# Patient Record
Sex: Male | Born: 1995 | Race: White | Hispanic: No | Marital: Single | Smoking: Current every day smoker
Health system: Southern US, Community
[De-identification: ages and names within clinical notes are randomized; demographics above are authoritative.]

## PROBLEM LIST (undated history)

## (undated) DIAGNOSIS — K219 Gastro-esophageal reflux disease without esophagitis: Secondary | ICD-10-CM

---
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
## 2019-09-04 ENCOUNTER — Emergency Department (HOSPITAL_COMMUNITY)
Admission: EM | Admit: 2019-09-04 | Discharge: 2019-09-05 | Disposition: A | Discharge status: Home / Self Care | Payer: Self-pay | Attending: Emergency Medicine | Admitting: Emergency Medicine

## 2019-09-04 ENCOUNTER — Other Ambulatory Visit: Payer: Self-pay

## 2019-09-04 ENCOUNTER — Encounter (HOSPITAL_COMMUNITY): Payer: Self-pay | Admitting: Emergency Medicine

## 2019-09-04 DIAGNOSIS — F17200 Nicotine dependence, unspecified, uncomplicated: Secondary | ICD-10-CM | POA: Insufficient documentation

## 2019-09-04 DIAGNOSIS — R441 Visual hallucinations: Secondary | ICD-10-CM | POA: Insufficient documentation

## 2019-09-04 DIAGNOSIS — Z20822 Contact with and (suspected) exposure to covid-19: Secondary | ICD-10-CM | POA: Insufficient documentation

## 2019-09-04 DIAGNOSIS — R440 Auditory hallucinations: Secondary | ICD-10-CM | POA: Insufficient documentation

## 2019-09-04 DIAGNOSIS — R443 Hallucinations, unspecified: Secondary | ICD-10-CM

## 2019-09-04 DIAGNOSIS — Z79899 Other long term (current) drug therapy: Secondary | ICD-10-CM | POA: Insufficient documentation

## 2019-09-04 DIAGNOSIS — Z046 Encounter for general psychiatric examination, requested by authority: Secondary | ICD-10-CM | POA: Insufficient documentation

## 2019-09-04 DIAGNOSIS — F15959 Other stimulant use, unspecified with stimulant-induced psychotic disorder, unspecified: Secondary | ICD-10-CM | POA: Insufficient documentation

## 2019-09-04 LAB — RAPID URINE DRUG SCREEN, HOSP PERFORMED
Amphetamines: NOT DETECTED
Barbiturates: NOT DETECTED
Benzodiazepines: NOT DETECTED
Cocaine: NOT DETECTED
Opiates: NOT DETECTED
Tetrahydrocannabinol: POSITIVE — AB

## 2019-09-04 LAB — COMPREHENSIVE METABOLIC PANEL
ALT: 58 U/L — ABNORMAL HIGH (ref 0–44)
AST: 32 U/L (ref 15–41)
Albumin: 3.8 g/dL (ref 3.5–5.0)
Alkaline Phosphatase: 95 U/L (ref 38–126)
Anion gap: 11 (ref 5–15)
BUN: 10 mg/dL (ref 6–20)
CO2: 25 mmol/L (ref 22–32)
Calcium: 8.9 mg/dL (ref 8.9–10.3)
Chloride: 104 mmol/L (ref 98–111)
Creatinine, Ser: 0.75 mg/dL (ref 0.61–1.24)
GFR calc Af Amer: >60 mL/min (ref >60)
GFR calc non Af Amer: >60 mL/min (ref >60)
Glucose, Bld: 106 mg/dL — ABNORMAL HIGH (ref 70–99)
Potassium: 4.1 mmol/L (ref 3.5–5.1)
Sodium: 140 mmol/L (ref 135–145)
Total Bilirubin: 0.3 mg/dL (ref 0.3–1.2)
Total Protein: 6.4 g/dL — ABNORMAL LOW (ref 6.5–8.1)

## 2019-09-04 LAB — ETHANOL: Alcohol, Ethyl (B): <10 mg/dL (ref <10)

## 2019-09-04 LAB — CBC
HCT: 45.6 % (ref 39.0–52.0)
Hemoglobin: 15.3 g/dL (ref 13.0–17.0)
MCH: 31.7 pg (ref 26.0–34.0)
MCHC: 33.6 g/dL (ref 30.0–36.0)
MCV: 94.6 fL (ref 80.0–100.0)
Platelets: 330 10*3/uL (ref 150–400)
RBC: 4.82 MIL/uL (ref 4.22–5.81)
RDW: 14.4 % (ref 11.5–15.5)
WBC: 9.3 10*3/uL (ref 4.0–10.5)
nRBC: 0 % (ref 0.0–0.2)

## 2019-09-04 NOTE — ED Notes (Signed)
Patient wearing purple scrubs/wanded by security.

## 2019-09-04 NOTE — ED Notes (Signed)
Patient stepped outside  

## 2019-09-04 NOTE — ED Triage Notes (Signed)
Patient requesting psychiatric treatment for his  auditory/visual hallucinations and methamphetamine addiction . Denies suicidal or homicidal ideation .

## 2019-09-05 ENCOUNTER — Emergency Department (HOSPITAL_COMMUNITY): Payer: Medicaid Other

## 2019-09-05 ENCOUNTER — Encounter (HOSPITAL_COMMUNITY): Payer: Self-pay | Admitting: Student

## 2019-09-05 LAB — SARS CORONAVIRUS 2 BY RT PCR (HOSPITAL ORDER, PERFORMED IN ~~LOC~~ HOSPITAL LAB): SARS Coronavirus 2: NEGATIVE

## 2019-09-05 MED ORDER — NICOTINE 21 MG/24HR TD PT24: 21.0000 mg | MEDICATED_PATCH | Freq: Every day | TRANSDERMAL | Status: DC

## 2019-09-05 MED ORDER — ONDANSETRON HCL 4 MG PO TABS: 4.0000 mg | ORAL_TABLET | Freq: Three times a day (TID) | ORAL | Status: DC | PRN

## 2019-09-05 MED ORDER — ALUM & MAG HYDROXIDE-SIMETH 200-200-20 MG/5ML PO SUSP: 30.0000 mL | Freq: Four times a day (QID) | ORAL | Status: DC | PRN

## 2019-09-05 MED ORDER — ACETAMINOPHEN 325 MG PO TABS: 650.0000 mg | ORAL_TABLET | ORAL | Status: DC | PRN

## 2019-09-05 MED ORDER — ZOLPIDEM TARTRATE 5 MG PO TABS: 5.0000 mg | ORAL_TABLET | Freq: Every evening | ORAL | Status: DC | PRN

## 2019-09-05 NOTE — ED Provider Notes (Addendum)
[facility] EMERGENCY DEPARTMENT Provider Note   CSN: [id number] Arrival date & time: 09/04/19  2233     History Chief Complaint  Patient presents with  . Hallucinations    Drug Addiction     Stephen Dickerson is a 24 y.o. male with a history of GERD who presents to the emergency department with complaints of hallucinations for the past 1 month.  Patient states he is having auditory and visual hallucinations daily, he states he hears voices and just sees things, he does not further elaborate on this.  No alleviating or aggravating factors.  No intervention prior to arrival.  He states that he had been using methamphetamines daily, he stopped this 1 week ago, however he has continued to have hallucinations.  He denies any additional substance use. Denies alcohol use.  He denies SI or HI.  He denies fever, chills, numbness, weakness, visual disturbance, passing out, or head injury.   HPI     Past Medical History:  Diagnosis Date  . GERD (gastroesophageal reflux disease)     There are no problems to display for this patient.   History reviewed. No pertinent surgical history.     No family history on file.  Social History   Tobacco Use  . Smoking status: Current Every Day Smoker  . Smokeless tobacco: Never Used  Substance Use Topics  . Alcohol use: Never  . Drug use: Yes    Types: Methamphetamines    Home Medications Prior to Admission medications   Medication Sig Start Date End Date Taking? Authorizing Provider  omeprazole (PRILOSEC) 20 MG capsule Take 20 mg by mouth daily.    [provider]  ranitidine (ZANTAC) 150 MG tablet Take 150 mg by mouth daily.    [provider]  sertraline (ZOLOFT) 100 MG tablet Take 100 mg by mouth daily.    [provider]    Allergies    Augmentin [amoxicillin-pot clavulanate]  Review of Systems   Review of Systems  Constitutional: Negative for chills and fever.  Respiratory: Negative for  shortness of breath.   Cardiovascular: Negative for chest pain.  Gastrointestinal: Negative for abdominal pain and vomiting.  Neurological: Negative for seizures, syncope, speech difficulty, weakness and numbness.  Psychiatric/Behavioral: Positive for hallucinations. Negative for suicidal ideas.  All other systems reviewed and are negative.   Physical Exam Updated Vital Signs BP 134/80 (BP Location: Left Arm)   Pulse 98   Temp 98.4 F (36.9 C) (Oral)   Resp 18   Ht 5\' 10"  (1.778 m)   Wt 85 kg   SpO2 98%   BMI 26.89 kg/m   Physical Exam Vitals and nursing note reviewed.  Constitutional:      General: He is not in acute distress.    Appearance: Normal appearance. He is not toxic-appearing.  HENT:     Head: Normocephalic and atraumatic.     Mouth/Throat:     Pharynx: Oropharynx is clear. Uvula midline.  Eyes:     General: Vision grossly intact. Gaze aligned appropriately.     Extraocular Movements: Extraocular movements intact.     Conjunctiva/sclera: Conjunctivae normal.     Pupils: Pupils are equal, round, and reactive to light.     Comments: No proptosis.   Cardiovascular:     Rate and Rhythm: Normal rate and regular rhythm.  Pulmonary:     Effort: Pulmonary effort is normal.     Breath sounds: Normal breath sounds.  Abdominal:     General:  There is no distension.     Palpations: Abdomen is soft.     Tenderness: There is no abdominal tenderness. There is no guarding or rebound.  Musculoskeletal:     Cervical back: Normal range of motion and neck supple. No rigidity.  Skin:    General: Skin is warm and dry.  Neurological:     Mental Status: He is alert.     Comments: Alert. Clear speech. No facial droop. CNIII-XII grossly intact. Bilateral upper and lower extremities' sensation grossly intact. 5/5 symmetric strength with grip strength and with plantar and dorsi flexion bilaterally . Normal finger to nose bilaterally. Negative pronator drift. Gait intact.      Psychiatric:        Behavior: Behavior is cooperative.        Thought Content: Thought content does not include homicidal or suicidal ideation.     Comments: Reports auditory & visual hallucinations.      ED Results / Procedures / Treatments   Labs (all labs ordered are listed, but only abnormal results are displayed) Labs Reviewed  COMPREHENSIVE METABOLIC PANEL - Abnormal; Notable for the following components:      Result Value   Glucose, Bld 106 (*)    Total Protein 6.4 (*)    ALT 58 (*)    All other components within normal limits  RAPID URINE DRUG SCREEN, HOSP PERFORMED - Abnormal; Notable for the following components:   Tetrahydrocannabinol POSITIVE (*)    All other components within normal limits  ETHANOL  CBC    EKG None  Radiology CT Head Wo Contrast  Result Date: 09/05/2019 CLINICAL DATA:  Hallucination related disorder EXAM: CT HEAD WITHOUT CONTRAST TECHNIQUE: Contiguous axial images were obtained from the base of the skull through the vertex without intravenous contrast. COMPARISON:  None. FINDINGS: Brain: Gray-white differentiation is maintained. No CT evidence of acute large territory infarct. No intraparenchymal or extra-axial mass or hemorrhage. Normal size and configuration of the ventricles and the basilar cisterns. No midline shift. Vascular: No hyperdense vessel or unexpected calcification. Skull: No displaced calvarial fracture. Sinuses/Orbits: Minimal polypoid mucosal thickening of the bilateral maxillary sinuses. The remaining paranasal sinuses and mastoid air cells aware aerated. No air-fluid levels. Other: Regional soft tissues appear normal. IMPRESSION: Negative noncontrast head CT. Electronically Signed   By: Simonne Come M.D.   On: 09/05/2019 05:51    Procedures Procedures (including critical care time)  Medications Ordered in ED Medications - No data to display  ED Course  I have reviewed the triage vital signs and the nursing notes.  Pertinent  labs & imaging results that were available during my care of the patient were reviewed by me and considered in my medical decision making (see chart for details).    MDM Rules/Calculators/A&P                         Patient presents to the ED with complaints of hallucinations.  Patient is nontoxic, resting comfortably. Alert & oriented. Vitals WNL. Benign exam.   Additional history obtained:  Additional history obtained from nursing note & chart review.  Lab Tests:  I reviewed and interpreted labs, which included:  CBC, CMP, ethanol level, & UDS, fairly unremarkable, UDS positive for tetrahydrocannabinol.  Imaging Studies ordered:  I ordered imaging studies which included CT head wo contrast, I independently visualized and interpreted imaging which showed was negative.  ED Course:  Patient with auditory and visual hallucinations in the setting of  methamphetamine use, however has stopped utilizing drugs for the past 1 week and hallucinations have persisted.  He has no significant laboratory abnormalities or findings on head CT as underlying etiology.  Patient is medically clear.  Will consult TTS for further assessment.  Disposition per [facility].   The patient has been placed in psychiatric observation due to the need to provide a safe environment for the patient while obtaining psychiatric consultation and evaluation, as well as ongoing medical and medication management to treat the patient's condition.  The patient has not been placed under full IVC at this time.  Findings and plan of care discussed with supervising physician Dr. Manus Gunning who is in agreement.   Portions of this note were generated with Scientist, clinical (histocompatibility and immunogenetics). Dictation errors may occur despite best attempts at proofreading.  Final Clinical Impression(s) / ED Diagnoses Final diagnoses:  Hallucinations    Rx / DC Orders ED Discharge Orders    None       Desmond Lope 09/05/19 0603  Discussed with Ala Dach  with TTS, recommendation is for patient to be evaluated by psychiatry as well as peer support today, consult placed to peer support.    Desmond Lope 09/05/19 0970    Glynn Octave, MD 09/05/19 [phone]

## 2019-09-05 NOTE — ED Notes (Signed)
TTS at bedside. 

## 2019-09-05 NOTE — Consult Note (Signed)
°  Stephen Dickerson is a 24 year old male who presented to the emergency room reporting auditory and visual hallucinations.  Today during evaluation patient endorsed much improvement of his symptoms secondary to rest.  Patient states that he is single with no children, currently employed in [employer] and is originally from [place] [facility] [place].  He states he came to visit his dad when his head began to hurt and he developed hallucinations.  He endorses a long-term history of methamphetamine and polysubstance abuse, stating he last used methamphetamines about 1 week ago.  His urine drug screen was positive for marijuana.  He denies any previous substance abuse treatment or residential services.  He originally endorsed some depressive symptoms, however could not identify any symptoms of depression.  Patient reports 4 previous suicide attempts with the most recent being 3 to 4 months ago.  He states for all 4 attempts was not overdosed on heroin" heroin and stuff like that."  He also reports that he has had about 4-5 admissions in the past month for substance abuse in [facility].  Those medical records are not available for review at this time.  Patient does endorse current legal charges to include possession of heroin, and new failure to appear as he missed his most recent court date.  Patient has given contradicting information as he reported to TTS counselor original court date was June 3, and he reported to me June 7 or June 9.  Discussed with patient legal ramifications that may interfere with his placement in a residential facility.  Patient verbalizes understanding and states he would like to seek treatment in a residential facility and then report to the magistrate with prove program completion."  I will tell them that my head was not right but I went to go get help."  At the time of this evaluation patient did deny suicidal ideations, homicidal ideations, and or hallucinations.  Will psychiatrically cleared patient this  afternoon pending transition of care consult peer support.

## 2019-09-05 NOTE — ED Notes (Signed)
Per Venetia Night at [facility], pt to see psychiatrist & peer support today, PA Sam notified as well.

## 2019-09-05 NOTE — BH Assessment (Signed)
Tele Assessment Note   Patient Name: Stephen Dickerson MRN: [id number] Referring Physician: Kennith Maes, PA-C Location of Patient: [facility], [phone] Location of Provider: [place]  Stephen Dickerson is an 24 y.o. single male who presents unaccompanied to [facility] reporting auditory and visual hallucinations. Pt reports, "My head isn't right." Pt reports he has been experiencing voices he cannot describe and seeing things that are not there, which he also cannot describe. Pt is a poor historian and gives single-word responses to most questions. He says these hallucinations began approximately one month ago and have increased in intensity. He reports using methamphetamines daily for the past two years and he stopped one week ago due to hallucinations. He says he has been sleeping very little. He denies problems with appetite. He denies depressive symptoms. He denies current suicidal ideation or history of suicide attempts. He denies current homicidal ideation or history of violence. He denies use of substance other than methamphetamines, however Pt's urine drug screen is positive for canabis.  Pt reports he is homeless. He cannot identify any family or friends who are supportive. Pt did not know the date and states he had a court date 08/26/19 that he missed regarding possession of heroin. Pt denies using heroin. He denies access to firearms. He denies any history of inpatient or outpatient mental health or substance abuse treatment. He denies ever being prescribed psychiatric medications.  Pt cannot identify anyone to contact for collateral information.  Pt is shirtless with scrub pants. He is alert and oriented person, place and situation. Pt speaks in a clear tone, at moderate volume and normal pace. Motor behavior appears normal. Eye contact is poor. Pt's mood is euthymic and affect is congruent with mood. Thought process is coherent and relevant. Pt was cooperative  throughout assessment. He is requesting medication to treat hallucinations.   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 GERD (gastroesophageal reflux disease)     History reviewed. No pertinent surgical history.  Family History: History reviewed. No pertinent family history.  Social History:  reports that he has been smoking. He has never used smokeless tobacco. He reports current drug use. Drug: Methamphetamines. He reports that he does not drink alcohol.  Additional Social History:  Alcohol / Drug Use Pain Medications: Denies abuse Prescriptions: Denies abuse Over the Counter: Denies abuse History of alcohol / drug use?: Yes Longest period of sobriety (when/how long): 1 week Negative Consequences of Use: Financial, Legal, Personal relationships, Work / School Substance #1 Name of Substance 1: Methamphetamines 1 - Age of First Use: 21 1 - Amount (size/oz): 1 gram 1 - Frequency: Daily 1 - Duration: 2 years 1 - Last Use / Amount: 08/29/2019  CIWA: CIWA-Ar BP: 122/75 Pulse Rate: 74 COWS:    Allergies:  Allergies  Allergen Reactions  . Augmentin [Amoxicillin-Pot Clavulanate] Nausea And Vomiting    Home Medications: (Not in a hospital admission)   OB/GYN Status:  No LMP for male patient.  General Assessment Data Location of Assessment: [facility] ED TTS Assessment: In system Is this a Tele or Face-to-Face Assessment?: Tele Assessment Is this an Initial Assessment or a Re-assessment for this encounter?: Initial Assessment Patient Accompanied by:: N/A Language Other than English: No Living Arrangements: Homeless/Shelter What gender do you identify as?: Male Date Telepsych consult ordered in [facility]: 09/05/19 Time Telepsych consult ordered in [facility]: 0603 Marital status: Single Stephen Dickerson  name: NA Pregnancy Status: No Living Arrangements: Alone Can pt return  to current living arrangement?: Yes Admission Status: Voluntary Is patient capable of signing voluntary admission?: Yes Referral Source: Self/Family/Friend Insurance type: Self-pay     Crisis Care Plan Living Arrangements: Alone Legal Guardian: Other: (Self) Name of Psychiatrist: None Name of Therapist: None  Education Status Is patient currently in school?: No Is the patient employed, unemployed or receiving disability?: Unemployed  Risk to self with the past 6 months Suicidal Ideation: No Has patient been a risk to self within the past 6 months prior to admission? : No Suicidal Intent: No Has patient had any suicidal intent within the past 6 months prior to admission? : No Is patient at risk for suicide?: No Suicidal Plan?: No Has patient had any suicidal plan within the past 6 months prior to admission? : No Access to Means: No What has been your use of drugs/alcohol within the last 12 months?: Pt reports using methamphetamines Previous Attempts/Gestures: No How many times?: 0 Other Self Harm Risks: None Triggers for Past Attempts: None known Intentional Self Injurious Behavior: None Family Suicide History: Unknown Recent stressful life event(s): Legal Issues Persecutory voices/beliefs?: No Depression: No Depression Symptoms: Insomnia Substance abuse history and/or treatment for substance abuse?: No Suicide prevention information given to non-admitted patients: Not applicable  Risk to Others within the past 6 months Homicidal Ideation: No Does patient have any lifetime risk of violence toward others beyond the six months prior to admission? : No Thoughts of Harm to Others: No Current Homicidal Intent: No Current Homicidal Plan: No Access to Homicidal Means: No Identified Victim: None History of harm to others?: No Assessment of Violence: None Noted Violent Behavior Description: Pt denies Does patient have access to weapons?: No Criminal Charges Pending?:  Yes Describe Pending Criminal Charges: Possession of heroin Does patient have a court date: Yes Court Date:  (unknown) Is patient on probation?: No  Psychosis Hallucinations: Auditory, Visual Delusions: None noted  Mental Status Report Appearance/Hygiene: Other (Comment) (No shirt, scrub bottom) Eye Contact: Poor Motor Activity: Freedom of movement Speech: Logical/coherent Level of Consciousness: Drowsy Mood: Euthymic Affect: Appropriate to circumstance Anxiety Level: None Thought Processes: Coherent Judgement: Partial Orientation: Person, Place, Situation Obsessive Compulsive Thoughts/Behaviors: None  Cognitive Functioning Concentration: Decreased Memory: Recent Intact, Remote Intact Is patient IDD: No Insight: Poor Impulse Control: Fair Appetite: Fair Have you had any weight changes? : No Change Sleep: Decreased Total Hours of Sleep: 3 Vegetative Symptoms: None  ADLScreening [facility] Assessment Services) Patient's cognitive ability adequate to safely complete daily activities?: Yes Patient able to express need for assistance with ADLs?: Yes Independently performs ADLs?: Yes (appropriate for developmental age)  Prior Inpatient Therapy Prior Inpatient Therapy: No  Prior Outpatient Therapy Prior Outpatient Therapy: No Does patient have an ACCT team?: No Does patient have Intensive In-House Services?  : No Does patient have [facility] services? : No Does patient have P4CC services?: No  ADL Screening (condition at time of admission) Patient's cognitive ability adequate to safely complete daily activities?: Yes Is the patient deaf or have difficulty hearing?: No Does the patient have difficulty seeing, even when wearing glasses/contacts?: No Does the patient have difficulty concentrating, remembering, or making decisions?: No Patient able to express need for assistance with ADLs?: Yes Does the patient have difficulty dressing or bathing?: No Independently performs  ADLs?: Yes (appropriate for developmental age) Does the patient have difficulty walking or climbing stairs?: No Weakness of Legs: None Weakness of Arms/Hands: None  Home Assistive Devices/Equipment Home Assistive Devices/Equipment: None    Abuse/Neglect Assessment (Assessment to be complete while patient is alone) Abuse/Neglect Assessment Can Be Completed: Yes Physical Abuse: Denies Verbal Abuse: Denies Sexual Abuse: Denies Exploitation of patient/patient's resources: Denies Self-Neglect: Denies     Advance Directives (For Healthcare) Does Patient Have a Medical Advance Directive?: No Would patient like information on creating a medical advance directive?: No - Patient declined          Disposition: Gave clinical report to Nira Conn, FNP who recommended Pt be observed and evaluated by psychiatry later today as well as consult by Peer Support. Notified Harvie Heck, PA-C and Zigmund Gottron, RN of recommendation.  Disposition Initial Assessment Completed for this Encounter: Yes  This service was provided via telemedicine using a 2-way, interactive audio and video technology.  Names of all persons participating in this telemedicine service and their role in this encounter. Name: Madelyn Flavors Role: Patient  Name: Shela Commons, [facility] Role: TTS counselor         Harlin Rain Patsy [place], [facility], [facility] Triage Specialist [phone]  Pamalee Leyden 09/05/2019 6:44 AM

## 2019-09-05 NOTE — Discharge Instructions (Signed)
  Residential Treatment Programs  [facility]  [place](2 years) Residential: 90 days)  [facility] [address] [facility]      [address] [place], [place]     [place], [place] [phone]      [phone] or [phone]  [facility]    [facility] [address]      [address] [place], [place]     [place], [place] [phone]      [phone]  [facility]   [facility] [address]     [address] [place], [place] [address]     [place], [place] [phone]      [phone] Admissions: 8am-3pm M-F  [facility]: Residential Program [phone] Days)              [facility]: [facility]  [place], [place]     [place], [place]  [phone] or [phone]    (Walk in Hours over the weekend or by referral)

## 2019-09-05 NOTE — Progress Notes (Signed)
Per Takia Starkes-Perry FNP, pt is psych clear pending TOC consult and peer support (pt would benefit from homeless shelter/community resources/substance abuse resources).   Yasheka Fossett S. Hasset Chaviano, MSW, LCSW Clinical Social Worker 09/05/2019 10:45 AM   

## 2019-09-05 NOTE — Care Management (Signed)
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 Was living with grandmother. Now homeless at present. Called [facility] CSW. Resources listed for patient in patient instructions. [employer] [facility] for Primary care.

## 2021-10-03 IMAGING — CT CT HEAD W/O CM
4 series · 16 of 47 positions shown, 18 images · non-contrast
Comparison: None.

CLINICAL DATA: Hallucination related disorder

EXAM:
CT HEAD WITHOUT CONTRAST
TECHNIQUE: Contiguous axial images were obtained from the base of the skull
through the vertex without intravenous contrast.

[Series 4: head without · axial · non-contrast · 0.45mm/px · z∈[-104,+21]mm · 7 of 35 slices shown, 9 images]
[im 5/35  brain]
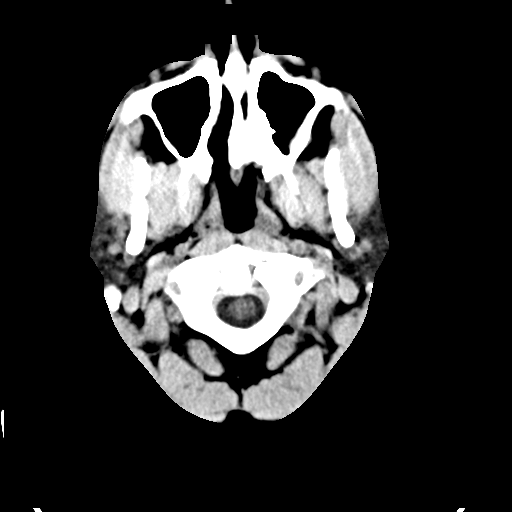
[im 5/35  bone]
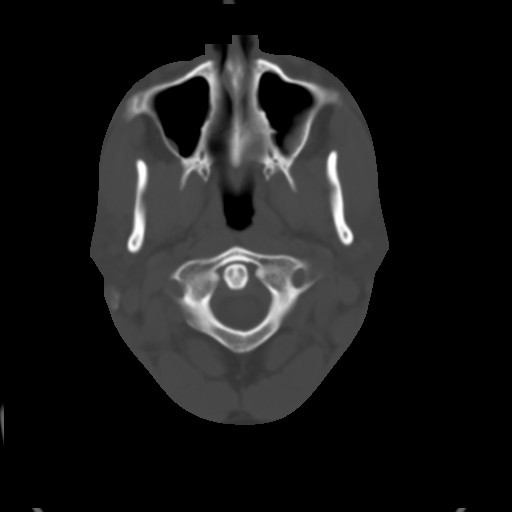
[im 9/35  brain]
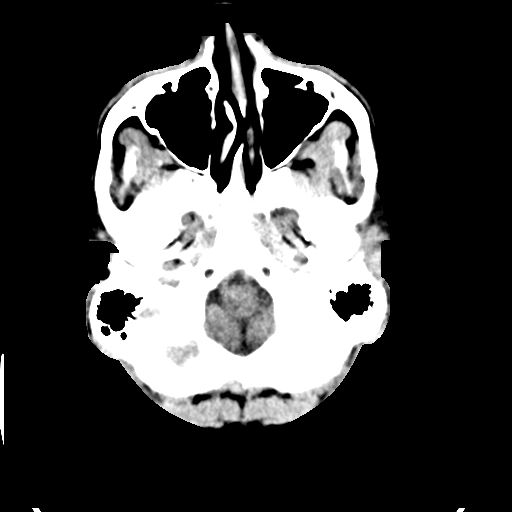
[im 13/35  brain]
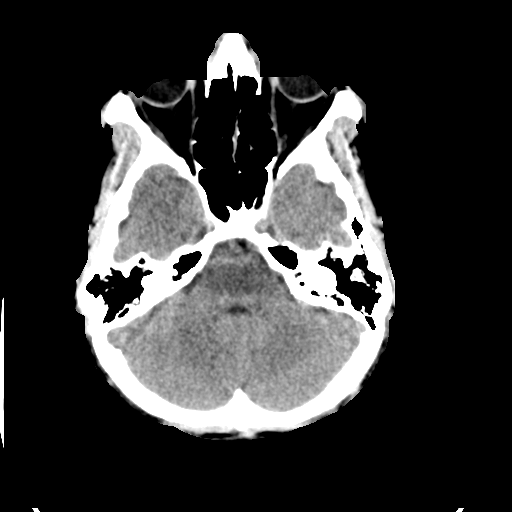
[im 18/35  brain]
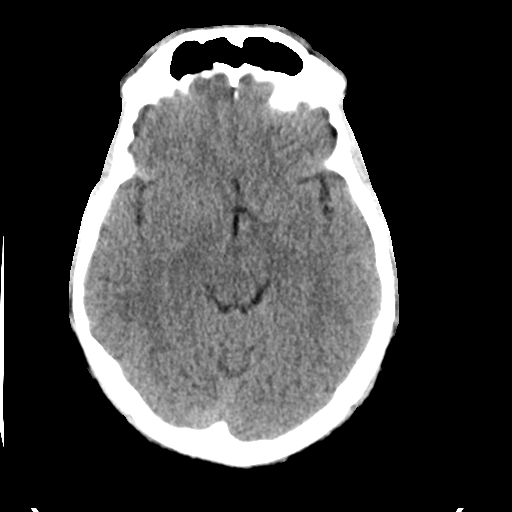
[im 22/35  brain]
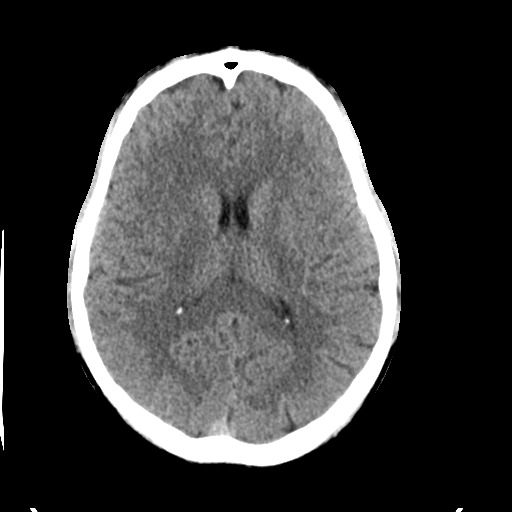
[im 22/35  bone]
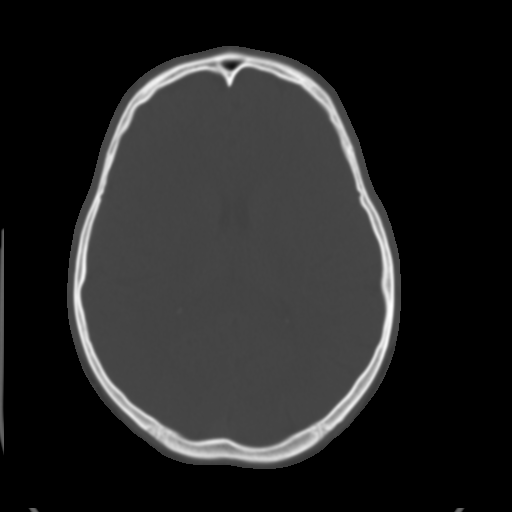
[im 26/35  brain]
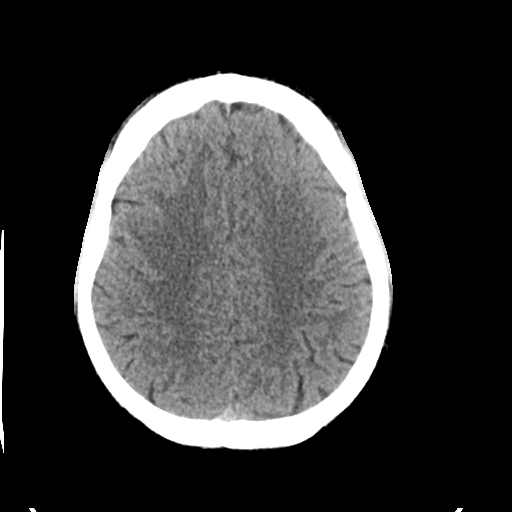
[im 30/35  brain]
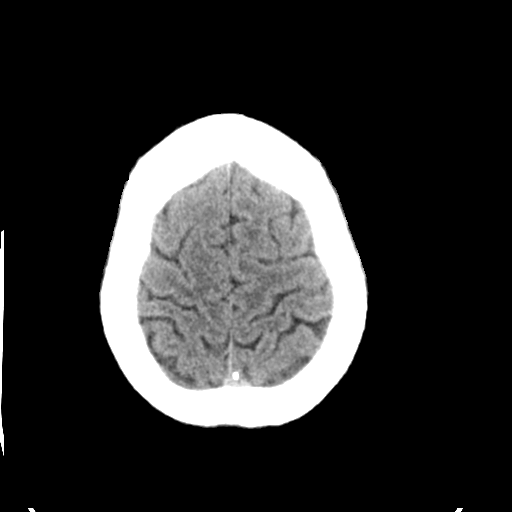

[Series 5: head bone · axial · 0.45mm/px · z∈[-108,-74]mm · 3 of 86 slices shown]
[im 9/86  bone]
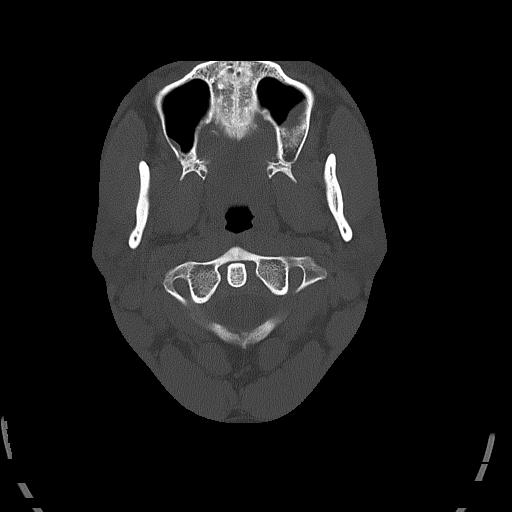
[im 18/86  bone]
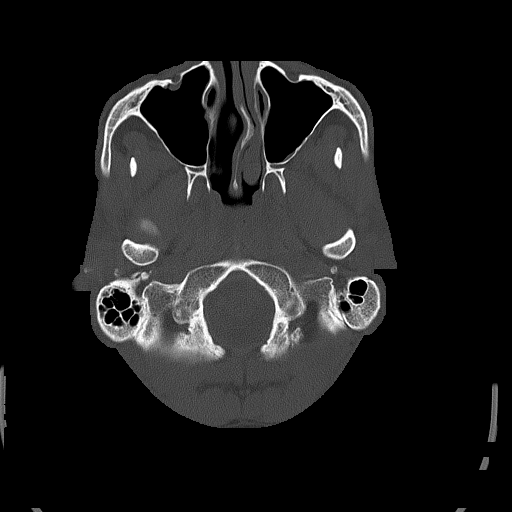
[im 26/86  bone]
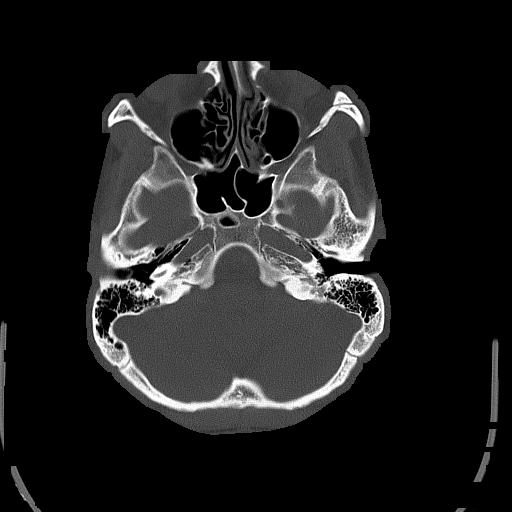

[Series 6: head without cor · coronal · non-contrast · 0.43mm/px · 3 of 81 slices shown]
[im 27/81  brain]
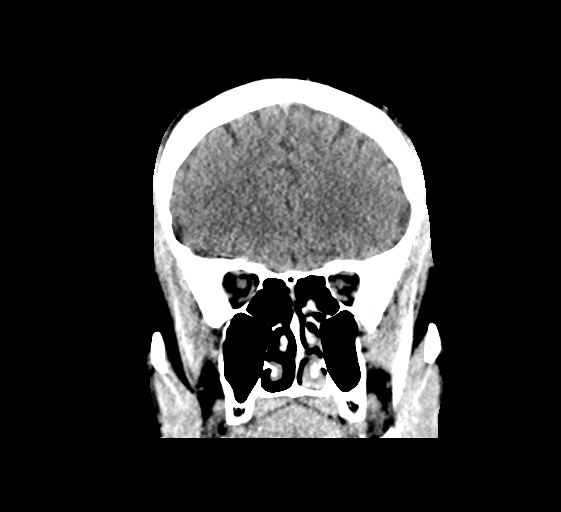
[im 36/81  brain]
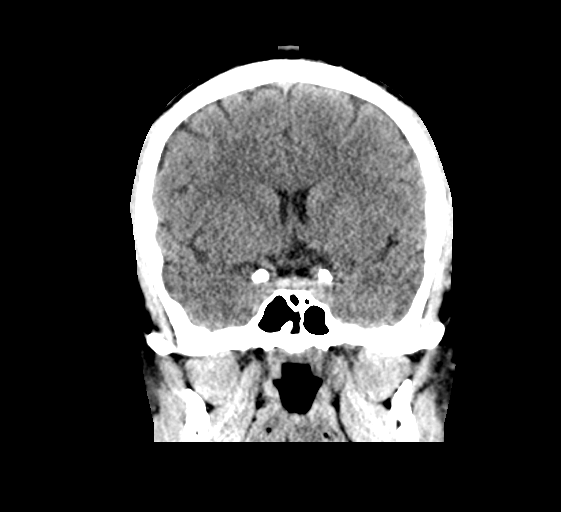
[im 45/81  brain]
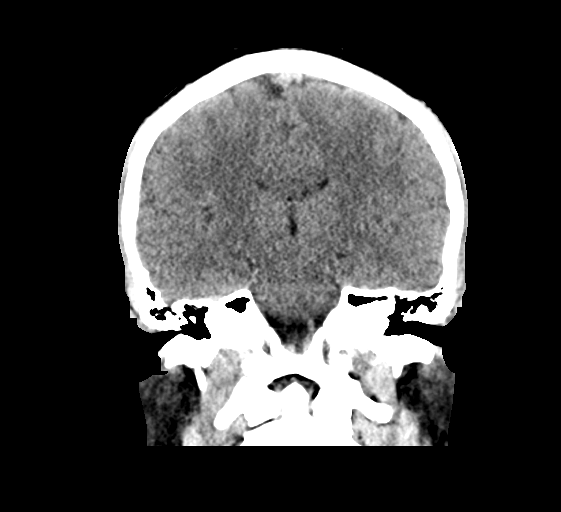

[Series 7: head without sag · sagittal · non-contrast · 0.39mm/px · 3 of 67 slices shown]
[im 23/67  brain]
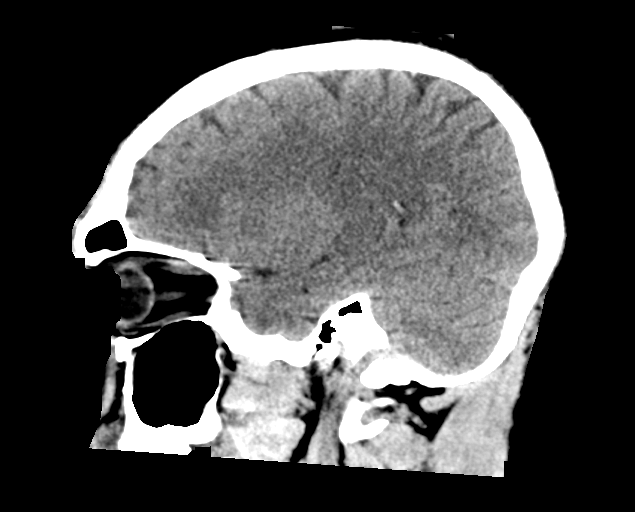
[im 34/67  brain]
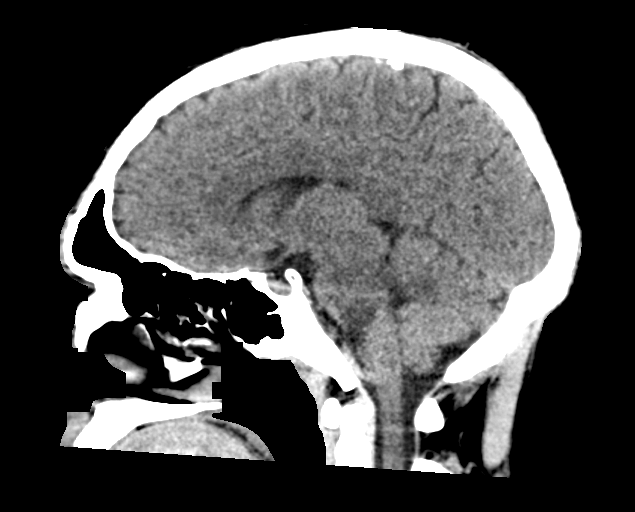
[im 45/67  brain]
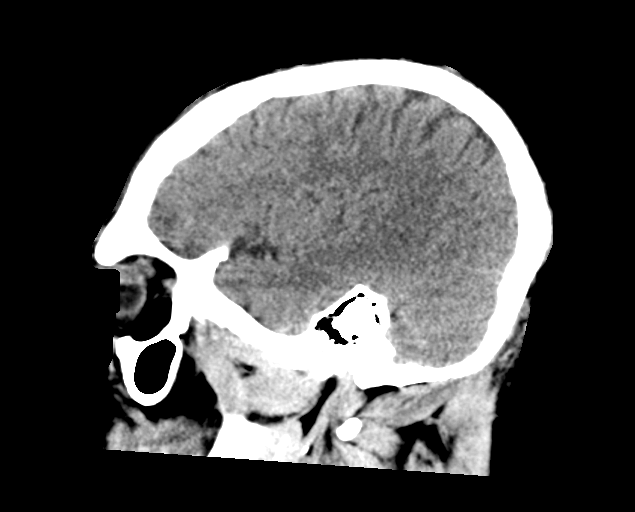

[16 of 47 positions shown; findings below may reference images not displayed]

FINDINGS: Brain: Gray-white differentiation is maintained. No CT evidence of
acute large territory infarct. No intraparenchymal or extra-axial
mass or hemorrhage. Normal size and configuration of the ventricles
and the basilar cisterns. No midline shift.

Vascular: No hyperdense vessel or unexpected calcification.

Skull: No displaced calvarial fracture.

Sinuses/Orbits: Minimal polypoid mucosal thickening of the bilateral
maxillary sinuses. The remaining paranasal sinuses and mastoid air
cells aware aerated. No air-fluid levels.

Other: Regional soft tissues appear normal.
IMPRESSION: Negative noncontrast head CT.
